# Patient Record
Sex: Female | Born: 1955 | Race: White | Hispanic: No | Marital: Married | Smoking: Never smoker
Health system: Southern US, Community
[De-identification: ages and names within clinical notes are randomized; demographics above are authoritative.]

## PROBLEM LIST (undated history)

## (undated) HISTORY — PX: FOOT SURGERY: SHX648

---

## 2015-10-03 ENCOUNTER — Emergency Department (HOSPITAL_COMMUNITY)
Admission: EM | Admit: 2015-10-03 | Discharge: 2015-10-03 | Disposition: A | Discharge status: Home / Self Care | Payer: BLUE CROSS/BLUE SHIELD | Attending: Emergency Medicine | Admitting: Emergency Medicine

## 2015-10-03 ENCOUNTER — Encounter (HOSPITAL_COMMUNITY): Payer: Self-pay | Admitting: *Deleted

## 2015-10-03 ENCOUNTER — Emergency Department (HOSPITAL_COMMUNITY): Payer: BLUE CROSS/BLUE SHIELD

## 2015-10-03 DIAGNOSIS — S42294A Other nondisplaced fracture of upper end of right humerus, initial encounter for closed fracture: Secondary | ICD-10-CM | POA: Insufficient documentation

## 2015-10-03 DIAGNOSIS — Y998 Other external cause status: Secondary | ICD-10-CM | POA: Insufficient documentation

## 2015-10-03 DIAGNOSIS — S4991XA Unspecified injury of right shoulder and upper arm, initial encounter: Secondary | ICD-10-CM | POA: Diagnosis present

## 2015-10-03 DIAGNOSIS — W000XXA Fall on same level due to ice and snow, initial encounter: Secondary | ICD-10-CM | POA: Diagnosis not present

## 2015-10-03 DIAGNOSIS — Y9289 Other specified places as the place of occurrence of the external cause: Secondary | ICD-10-CM | POA: Diagnosis not present

## 2015-10-03 DIAGNOSIS — Y9389 Activity, other specified: Secondary | ICD-10-CM | POA: Insufficient documentation

## 2015-10-03 DIAGNOSIS — S42201A Unspecified fracture of upper end of right humerus, initial encounter for closed fracture: Secondary | ICD-10-CM

## 2015-10-03 DIAGNOSIS — M25511 Pain in right shoulder: Secondary | ICD-10-CM

## 2015-10-03 DIAGNOSIS — Z791 Long term (current) use of non-steroidal anti-inflammatories (NSAID): Secondary | ICD-10-CM | POA: Diagnosis not present

## 2015-10-03 DIAGNOSIS — Z79899 Other long term (current) drug therapy: Secondary | ICD-10-CM | POA: Diagnosis not present

## 2015-10-03 MED ORDER — LIDOCAINE HCL (PF) 1 % IJ SOLN
30.0000 mL | Freq: Once | INTRAMUSCULAR | Status: DC
  Filled 2015-10-03: qty 30

## 2015-10-03 MED ORDER — HYDROMORPHONE HCL 1 MG/ML IJ SOLN
1.0000 mg | Freq: Once | INTRAMUSCULAR | Status: AC
  Administered 2015-10-03: 1 mg via INTRAMUSCULAR
  Filled 2015-10-03: qty 1

## 2015-10-03 MED ORDER — HYDROCODONE-ACETAMINOPHEN 5-325 MG PO TABS: 1.0000 | ORAL_TABLET | Freq: Four times a day (QID) | ORAL | Status: AC | PRN

## 2015-10-03 NOTE — Discharge Instructions (Signed)
Humerus Fracture Treated With Immobilization °The humerus is the large bone in your upper arm. You have a broken (fractured) humerus. These fractures are easily diagnosed with X-rays. °TREATMENT  °Simple fractures which will heal without disability are treated with simple immobilization. Immobilization means you will wear a cast, splint, or sling. You have a fracture which will do well with immobilization. The fracture will heal well simply by being held in a good position until it is stable enough to begin range of motion exercises. Do not take part in activities which would further injure your arm.  °HOME CARE INSTRUCTIONS  °· Put ice on the injured area. °¨ Put ice in a plastic bag. °¨ Place a towel between your skin and the bag. °¨ Leave the ice on for 15-20 minutes, 03-04 times a day. °· If you have a cast: °¨ Do not scratch the skin under the cast using sharp or pointed objects. °¨ Check the skin around the cast every day. You may put lotion on any red or sore areas. °¨ Keep your cast dry and clean. °· If you have a splint: °¨ Wear the splint as directed. °¨ Keep your splint dry and clean. °¨ You may loosen the elastic around the splint if your fingers become numb, tingle, or turn cold or blue. °· If you have a sling: °¨ Wear the sling as directed. °· Do not put pressure on any part of your cast or splint until it is fully hardened. °· Your cast or splint can be protected during bathing with a plastic bag. Do not lower the cast or splint into water. °· Only take over-the-counter or prescription medicines for pain, discomfort, or fever as directed by your caregiver. °· Do range of motion exercises as instructed by your caregiver. °· Follow up as directed by your caregiver. This is very important in order to avoid permanent injury or disability and chronic pain. °SEEK IMMEDIATE MEDICAL CARE IF:  °· Your skin or nails in the injured arm turn blue or gray. °· Your arm feels cold or numb. °· You develop severe pain  in the injured arm. °· You are having problems with the medicines you were given. °MAKE SURE YOU:  °· Understand these instructions. °· Will watch your condition. °· Will get help right away if you are not doing well or get worse. °  °This information is not intended to replace advice given to you by your health care provider. Make sure you discuss any questions you have with your health care provider. °  °Document Released: 12/17/2000 Document Revised: 10/01/2014 Document Reviewed: 02/02/2015 °Elsevier Interactive Patient Education ©2016 [employer] ° °

## 2015-10-03 NOTE — ED Notes (Signed)
Pt reports falling and landing on rt shoulder in ice. Pt reports pain to shoulder. Pt able to move fingers and has sensation.

## 2015-10-03 NOTE — ED Notes (Signed)
MD at bedside. 

## 2015-10-03 NOTE — ED Provider Notes (Signed)
CSN: [id number]     Arrival date & time 10/03/15  0841 History   First MD Initiated Contact with Patient 10/03/15 [id number]     Chief Complaint  Patient presents with  . Fall  . Shoulder Injury   60 yo caucasian F w/PMH of sciatica on meloxicam who presents w/right shoulder pain after fall. She slipped on the ice and fell directly onto her right shoulder and heard a pop. Initially couldn't move her arm at all 2/2 pain. Now keeps elbow bent and arm against her chest and can easily mover her hand and wrist. Complains of anterior shoulder pain, sharp and achy, nonradiating. Had no LOC, didn't strike head.   She denies HA, neck pain, numbness/tingling in arm, CP, SOB, fever, chills, N/V, diarrhea, constipation, hematemesis, dysuria, hematuria, sick contacts, or recent travel.   (Consider location/radiation/quality/duration/timing/severity/associated sxs/prior Treatment) Patient is a 60 y.o. female presenting with shoulder injury.  Shoulder Injury This is a new problem. The current episode started today. The problem occurs constantly. The problem has been unchanged. Associated symptoms include arthralgias (right shoulder pain). Pertinent negatives include no abdominal pain, chest pain, chills, coughing, diaphoresis, fever, headaches, nausea, neck pain, numbness or vomiting. Exacerbated by: moving right arm. She has tried nothing for the symptoms. The treatment provided no relief.    History reviewed. No pertinent past medical history. Past Surgical History  Procedure Laterality Date  . Foot surgery     No family history on file. Social History  Substance Use Topics  . Smoking status: Never Smoker   . Smokeless tobacco: None  . Alcohol Use: No   OB History    No data available     Review of Systems  Constitutional: Negative for fever, chills and diaphoresis.  Respiratory: Negative for cough and shortness of breath.   Cardiovascular: Negative for chest pain, palpitations and leg swelling.   Gastrointestinal: Negative for nausea, vomiting, abdominal pain, diarrhea, constipation and abdominal distention.  Genitourinary: Negative for dysuria, frequency, flank pain and decreased urine volume.  Musculoskeletal: Positive for arthralgias (right shoulder pain). Negative for back pain and neck pain.  Neurological: Negative for dizziness, speech difficulty, light-headedness, numbness and headaches.  All other systems reviewed and are negative.     Allergies  Ciprofloxacin  Home Medications   Prior to Admission medications   Medication Sig Start Date End Date Taking? Authorizing Provider  Calcium-Vitamin D-Vitamin K (CALCIUM + D) [phone] MG-UNT-MCG CHEW Chew 1 tablet by mouth daily.   Yes Historical Provider, MD  Cholecalciferol (VITAMIN D) 2000 units CAPS Take 2,000 Units by mouth daily.   Yes Historical Provider, MD  Cyanocobalamin (VITAMIN B 12) 100 MCG LOZG Take 100 mcg by mouth daily.   Yes Historical Provider, MD  HYDROcodone-acetaminophen (NORCO/VICODIN) 5-325 MG tablet Take 1-2 tablets by mouth every 6 (six) hours as needed for moderate pain or severe pain. 10/03/15   Rachelle HoraKeri Greyden Besecker, MD  meloxicam (MOBIC) 15 MG tablet Take 15 mg by mouth daily.   Yes Historical Provider, MD  Multiple Vitamins-Minerals (MULTIVITAMIN WITH MINERALS) tablet Take 1 tablet by mouth daily.   Yes Historical Provider, MD  Omega-3 Fatty Acids (FISH OIL) 1000 MG CPDR Take 1,000 mg by mouth daily.   Yes Historical Provider, MD  pseudoephedrine (SUDAFED) 30 MG tablet Take 30 mg by mouth every 4 (four) hours as needed for congestion.   Yes Historical Provider, MD  psyllium (METAMUCIL) 58.6 % powder Take 1 packet by mouth daily.   Yes Historical Provider, MD  Resveratrol  100 MG CAPS Take 1 capsule by mouth 2 (two) times daily.   Yes Historical Provider, MD   BP 122/84 mmHg  Pulse 72  Temp(Src) 98.1 F (36.7 C) (Oral)  Resp 16  SpO2 100% Physical Exam  Constitutional: She is oriented to person, place,  and time. She appears well-developed and well-nourished. No distress.  HENT:  Head: Normocephalic and atraumatic.  Cardiovascular: Normal rate, regular rhythm, normal heart sounds and intact distal pulses.  Exam reveals no gallop and no friction rub.   No murmur heard. Pulmonary/Chest: Effort normal and breath sounds normal. No respiratory distress. She has no wheezes. She has no rales. She exhibits no tenderness.  Abdominal: Soft. Bowel sounds are normal. She exhibits no distension and no mass. There is no tenderness. There is no rebound and no guarding.  Musculoskeletal: She exhibits tenderness (right anterior shoulder. ).  Right shoulder appears anterior and superior. Ttp of anterior shoulder. No humerus, elbow, wrist, hand ttp. Normal pulses in wrist. Normal sensation. Compartments soft.  Lymphadenopathy:    She has no cervical adenopathy.  Neurological: She is alert and oriented to person, place, and time. No cranial nerve deficit. Coordination normal.  Skin: Skin is warm and dry. She is not diaphoretic.  Nursing note and vitals reviewed.   ED Course  Procedures (including critical care time) Labs Review Labs Reviewed - No data to display  Imaging Review Dg Shoulder Right  10/03/2015  CLINICAL DATA:  60 year old female who slipped and fell on ice landing on right shoulder with pain. Initial encounter. EXAM: RIGHT SHOULDER - 2+ VIEW COMPARISON:  None. FINDINGS: No glenohumeral joint dislocation. However, there is subtle cortical irregularity about the humeral head and neck suspicious for nondisplaced fracture. Small chronic appearing ossific fragment at the inferior lip of the glenoid. The scapula otherwise appears intact. Right clavicle appears intact. Visible right ribs and lung parenchyma within normal limits. IMPRESSION: 1. Appearance suspicious for nondisplaced proximal right humerus fracture. Noncontrast right shoulder CT may be most valuable for follow-up. 2. No glenohumeral joint  dislocation. Chronic appearing ossific fragment at the inferior lip of the glenoid. Electronically Signed   By: Odessa Fleming M.D.   On: 10/03/2015 09:20   I have personally reviewed and evaluated these images and lab results as part of my medical decision-making.   EKG Interpretation None      MDM   Final diagnoses:  Right shoulder pain  Fracture, humerus, proximal, right, closed, initial encounter   60 yo F w/acute right shoulder pain after fall. See HPI for details. On exam, NAD, AFVSS. As above, shoulder appears anteriorly dislocated. NVI to right extremity. Will obtain XR to eval for frx as well.  Given IM dilaudid and plan to place intra-articular lidocaine for reduction.   XR shows nondisplaced proximal humerus fx. No dislocation. Therefore, placed in sling. DC w/FU to ortho. Given short course of pain meds.    Pt was seen under the supervision of Dr. Hyacinth Meeker.     Rachelle Hora, MD 10/03/15 1610  Eber Hong, MD 10/03/15 [phone]

## 2015-10-03 NOTE — ED Notes (Signed)
Ice pack applied.

## 2019-11-05 ENCOUNTER — Emergency Department (HOSPITAL_COMMUNITY): Payer: BC Managed Care – PPO

## 2019-11-05 ENCOUNTER — Emergency Department (HOSPITAL_COMMUNITY)
Admission: EM | Admit: 2019-11-05 | Discharge: 2019-11-05 | Disposition: A | Discharge status: Home / Self Care | Payer: BC Managed Care – PPO | Attending: Emergency Medicine | Admitting: Emergency Medicine

## 2019-11-05 ENCOUNTER — Other Ambulatory Visit: Payer: Self-pay

## 2019-11-05 ENCOUNTER — Encounter (HOSPITAL_COMMUNITY): Payer: Self-pay

## 2019-11-05 DIAGNOSIS — R0789 Other chest pain: Secondary | ICD-10-CM | POA: Insufficient documentation

## 2019-11-05 DIAGNOSIS — E785 Hyperlipidemia, unspecified: Secondary | ICD-10-CM | POA: Diagnosis not present

## 2019-11-05 DIAGNOSIS — M549 Dorsalgia, unspecified: Secondary | ICD-10-CM | POA: Diagnosis not present

## 2019-11-05 DIAGNOSIS — R110 Nausea: Secondary | ICD-10-CM | POA: Diagnosis not present

## 2019-11-05 DIAGNOSIS — R079 Chest pain, unspecified: Secondary | ICD-10-CM

## 2019-11-05 LAB — CBC
HCT: 41 % (ref 36.0–46.0)
Hemoglobin: 13.2 g/dL (ref 12.0–15.0)
MCH: 28.1 pg (ref 26.0–34.0)
MCHC: 32.2 g/dL (ref 30.0–36.0)
MCV: 87.4 fL (ref 80.0–100.0)
Platelets: 340 10*3/uL (ref 150–400)
RBC: 4.69 MIL/uL (ref 3.87–5.11)
RDW: 13.2 % (ref 11.5–15.5)
WBC: 7.8 10*3/uL (ref 4.0–10.5)
nRBC: 0 % (ref 0.0–0.2)

## 2019-11-05 LAB — BASIC METABOLIC PANEL
Anion gap: 8 (ref 5–15)
BUN: 28 mg/dL — ABNORMAL HIGH (ref 8–23)
CO2: 25 mmol/L (ref 22–32)
Calcium: 9.3 mg/dL (ref 8.9–10.3)
Chloride: 107 mmol/L (ref 98–111)
Creatinine, Ser: 0.94 mg/dL (ref 0.44–1.00)
GFR calc Af Amer: >60 mL/min (ref >60)
GFR calc non Af Amer: >60 mL/min (ref >60)
Glucose, Bld: 159 mg/dL — ABNORMAL HIGH (ref 70–99)
Potassium: 4.1 mmol/L (ref 3.5–5.1)
Sodium: 140 mmol/L (ref 135–145)

## 2019-11-05 LAB — TROPONIN I (HIGH SENSITIVITY)
Troponin I (High Sensitivity): <2 ng/L (ref <18)
Troponin I (High Sensitivity): <2 ng/L (ref <18)

## 2019-11-05 LAB — D-DIMER, QUANTITATIVE (NOT AT ARMC): D-Dimer, Quant: 0.79 ug/mL-FEU — ABNORMAL HIGH (ref 0.00–0.50)

## 2019-11-05 MED ORDER — IOHEXOL 350 MG/ML SOLN
100.0000 mL | Freq: Once | INTRAVENOUS | Status: AC | PRN
  Administered 2019-11-05: 100 mL via INTRAVENOUS

## 2019-11-05 NOTE — ED Provider Notes (Signed)
[facility] EMERGENCY DEPARTMENT Provider Note   CSN: [id number] Arrival date & time: 11/05/19  1351     History Chief Complaint  Patient presents with  . Chest Pain   Katherine Barajas is a 64 y.o. female.  The history is provided by the patient and medical records.  Chest Pain Pain location:  Substernal area Pain quality: sharp and stabbing   Pain radiates to:  Upper back Pain severity:  Severe Onset quality:  Sudden Duration:  3 hours Timing:  Intermittent Progression:  Waxing and waning Chronicity:  New Context: at rest   Context: not trauma   Relieved by:  Nitroglycerin Worsened by:  Deep breathing and certain positions Associated symptoms: back pain, diaphoresis and nausea   Associated symptoms: no abdominal pain, no cough, no fever, no lower extremity edema, no shortness of breath and no vomiting   Risk factors: high cholesterol   Risk factors: no coronary artery disease, no diabetes mellitus, no hypertension, not female, not obese, no prior DVT/PE and no surgery        History reviewed. No pertinent past medical history.  There are no problems to display for this patient.   Past Surgical History:  Procedure Laterality Date  . FOOT SURGERY       OB History   No obstetric history on file.     History reviewed. No pertinent family history.  Social History   Tobacco Use  . Smoking status: Never Smoker  Substance Use Topics  . Alcohol use: No  . Drug use: Not on file    Home Medications Prior to Admission medications   Medication Sig Start Date End Date Taking? Authorizing Provider  Cholecalciferol (VITAMIN D) 2000 units CAPS Take 2,000 Units by mouth daily.   Yes [provider]  meloxicam (MOBIC) 15 MG tablet Take 15 mg by mouth daily.   Yes [provider]  Multiple Vitamins-Minerals (MULTIVITAMIN WITH MINERALS) tablet Take 1 tablet by mouth daily.   Yes [provider]  Omega-3 Fatty Acids (FISH  OIL) 1000 MG CPDR Take 2,000 mg by mouth daily.    Yes [provider]  omeprazole (PRILOSEC) 40 MG capsule Take 40 mg by mouth daily.   Yes [provider]  psyllium (METAMUCIL) 58.6 % powder Take 1 packet by mouth daily.   Yes [provider]  Red Yeast Rice Extract 600 MG TABS Take 3 tablets by mouth daily.   Yes [provider]  HYDROcodone-acetaminophen (NORCO/VICODIN) 5-325 MG tablet Take 1-2 tablets by mouth every 6 (six) hours as needed for moderate pain or severe pain. Patient not taking: Reported on 11/05/2019 10/03/15   Rachelle Hora, MD    Allergies    Ciprofloxacin and Dilaudid [hydromorphone]  Review of Systems   Review of Systems  Constitutional: Positive for diaphoresis. Negative for fever.  Respiratory: Negative for cough and shortness of breath.   Cardiovascular: Positive for chest pain. Negative for leg swelling.  Gastrointestinal: Positive for nausea. Negative for abdominal pain and vomiting.  Musculoskeletal: Positive for back pain.  All other systems reviewed and are negative.   Physical Exam Updated Vital Signs BP 125/71 (BP Location: Right Arm)   Pulse 78   Temp 98.3 F (36.8 C) (Oral)   Resp 20   Ht 5' (1.524 m)   Wt 76.2 kg   SpO2 100%   BMI 32.81 kg/m   Physical Exam Vitals and nursing note reviewed.  Constitutional:      Appearance: She  is well-developed. She is not toxic-appearing or diaphoretic.  HENT:     Head: Normocephalic and atraumatic.  Eyes:     Conjunctiva/sclera: Conjunctivae normal.  Cardiovascular:     Rate and Rhythm: Normal rate and regular rhythm.     Pulses:          Radial pulses are 2+ on the right side and 2+ on the left side.       Dorsalis pedis pulses are 2+ on the right side and 2+ on the left side.     Heart sounds: No murmur.  Pulmonary:     Effort: Pulmonary effort is normal. No respiratory distress.     Breath sounds: Normal breath sounds.  Chest:     Chest wall: Tenderness  present.  Abdominal:     Palpations: Abdomen is soft.     Tenderness: There is no abdominal tenderness.  Musculoskeletal:     Cervical back: Neck supple.     Right lower leg: Edema (trace) present.     Left lower leg: Edema (trace) present.  Skin:    General: Skin is warm and dry.  Neurological:     Mental Status: She is alert.     ED Results / Procedures / Treatments   Labs (all labs ordered are listed, but only abnormal results are displayed) Labs Reviewed  BASIC METABOLIC PANEL - Abnormal; Notable for the following components:      Result Value   Glucose, Bld 159 (*)    BUN 28 (*)    All other components within normal limits  D-DIMER, QUANTITATIVE (NOT AT [facility]) - Abnormal; Notable for the following components:   D-Dimer, Quant 0.79 (*)    All other components within normal limits  CBC  TROPONIN I (HIGH SENSITIVITY)  TROPONIN I (HIGH SENSITIVITY)    EKG EKG Interpretation  Date/Time:  Thursday November 05 2019 13:56:27 EST Ventricular Rate:  82 PR Interval:  144 QRS Duration: 76 QT Interval:  352 QTC Calculation: 411 R Axis:   25 Text Interpretation: Normal sinus rhythm Low voltage QRS Cannot rule out Anterior infarct , age undetermined Abnormal ECG Confirmed by Madalyn Rob [phone]) on 11/05/2019 3:42:20 PM   Radiology DG Chest 2 View  Result Date: 11/05/2019 CLINICAL DATA:  Chest pain and nausea EXAM: CHEST - 2 VIEW COMPARISON:  None. FINDINGS: The heart size and mediastinal contours are within normal limits. Both lungs are clear. The visualized skeletal structures are unremarkable. IMPRESSION: No active cardiopulmonary disease. Electronically Signed   By: Inez Catalina M.D.   On: 11/05/2019 14:27   CT ANGIO CHEST PE W OR WO CONTRAST  Result Date: 11/05/2019 CLINICAL DATA:  Diaphoresis and dizziness after eating. Chest and back pain. EXAM: CT ANGIOGRAPHY CHEST WITH CONTRAST TECHNIQUE: Multidetector CT imaging of the chest was performed using the standard  protocol during bolus administration of intravenous contrast. Multiplanar CT image reconstructions and MIPs were obtained to evaluate the vascular anatomy. CONTRAST:  152mL OMNIPAQUE IOHEXOL 350 MG/ML SOLN COMPARISON:  None. FINDINGS: Cardiovascular: Pulmonary arterial opacification is good. There are no pulmonary emboli. No evidence of aortic atherosclerosis. No visible coronary artery calcification. Heart size is normal. Mediastinum/Nodes: No mediastinal or hilar mass or lymphadenopathy. Lungs/Pleura: Linear density in the right middle lobe likely represent a scar. Lung bases otherwise clear. No pleural fluid. Upper Abdomen: 2 adjacent cysts in the left lobe of the liver, the larger measuring 2.7 cm in diameter. Musculoskeletal: Negative Review of the MIP images confirms the above findings. IMPRESSION:  Negative CT angiography of the chest. No pulmonary emboli or other acute chest pathology. Likely linear scar in the right middle lobe. Electronically Signed   By: Paulina Fusi M.D.   On: 11/05/2019 19:50    Procedures Procedures (including critical care time)  Medications Ordered in ED Medications  iohexol (OMNIPAQUE) 350 MG/ML injection 100 mL (100 mLs Intravenous Contrast Given 11/05/19 1920)    ED Course  I have reviewed the triage vital signs and the nursing notes.  Pertinent labs & imaging results that were available during my care of the patient were reviewed by me and considered in my medical decision making (see chart for details).    MDM Rules/Calculators/A&P                      Katherine Barajas is a 64 y.o. female with past medical history of hyperlipidemia, chronic low back pain who presents to the ED for chest pain.  HEAR of 4 but troponin undetectably negative x 2, no ischemic changes on EKG.  Low risk wells, dimer elevated, CTA without evidence of PE.  No pulse deficits, no tearing/ripping pain, no neurologic deficits and reassuring CTA, do not suspect dissection.  Pt does have  history of PUD, GERD maybe the cause of her symptoms.    Will follow up with PCP.  Strict return precautions given.  Discharged in stable condition.      Final Clinical Impression(s) / ED Diagnoses Final diagnoses:  Chest pain, unspecified type    Rx / DC Orders ED Discharge Orders    None       Leva Baine, [place], MD 11/05/19 2324    Milagros Loll, MD 11/05/19 2328

## 2019-11-05 NOTE — ED Triage Notes (Addendum)
Pt BIB [facility] for eval of sudden onset CP radiation to back, diaphoresis and nausea. Pt reports 7/10 CP. Pt states pain started at 10/10, decreased to 7/10 on EMS arrival and down to 2/10 after 1 SL NTG. EMS reports appeared pale, diaphoretic, dizzy. EMS reports by her presentation they were concerned for significant cardiac event. Pt appears much better in triage, but states pain is beginning to return

## 2020-12-25 IMAGING — CR DG CHEST 2V
2 series · 2 of 2 positions shown · non-contrast
Comparison: None.

CLINICAL DATA: Chest pain and nausea

EXAM:
CHEST - 2 VIEW

[chest pa]
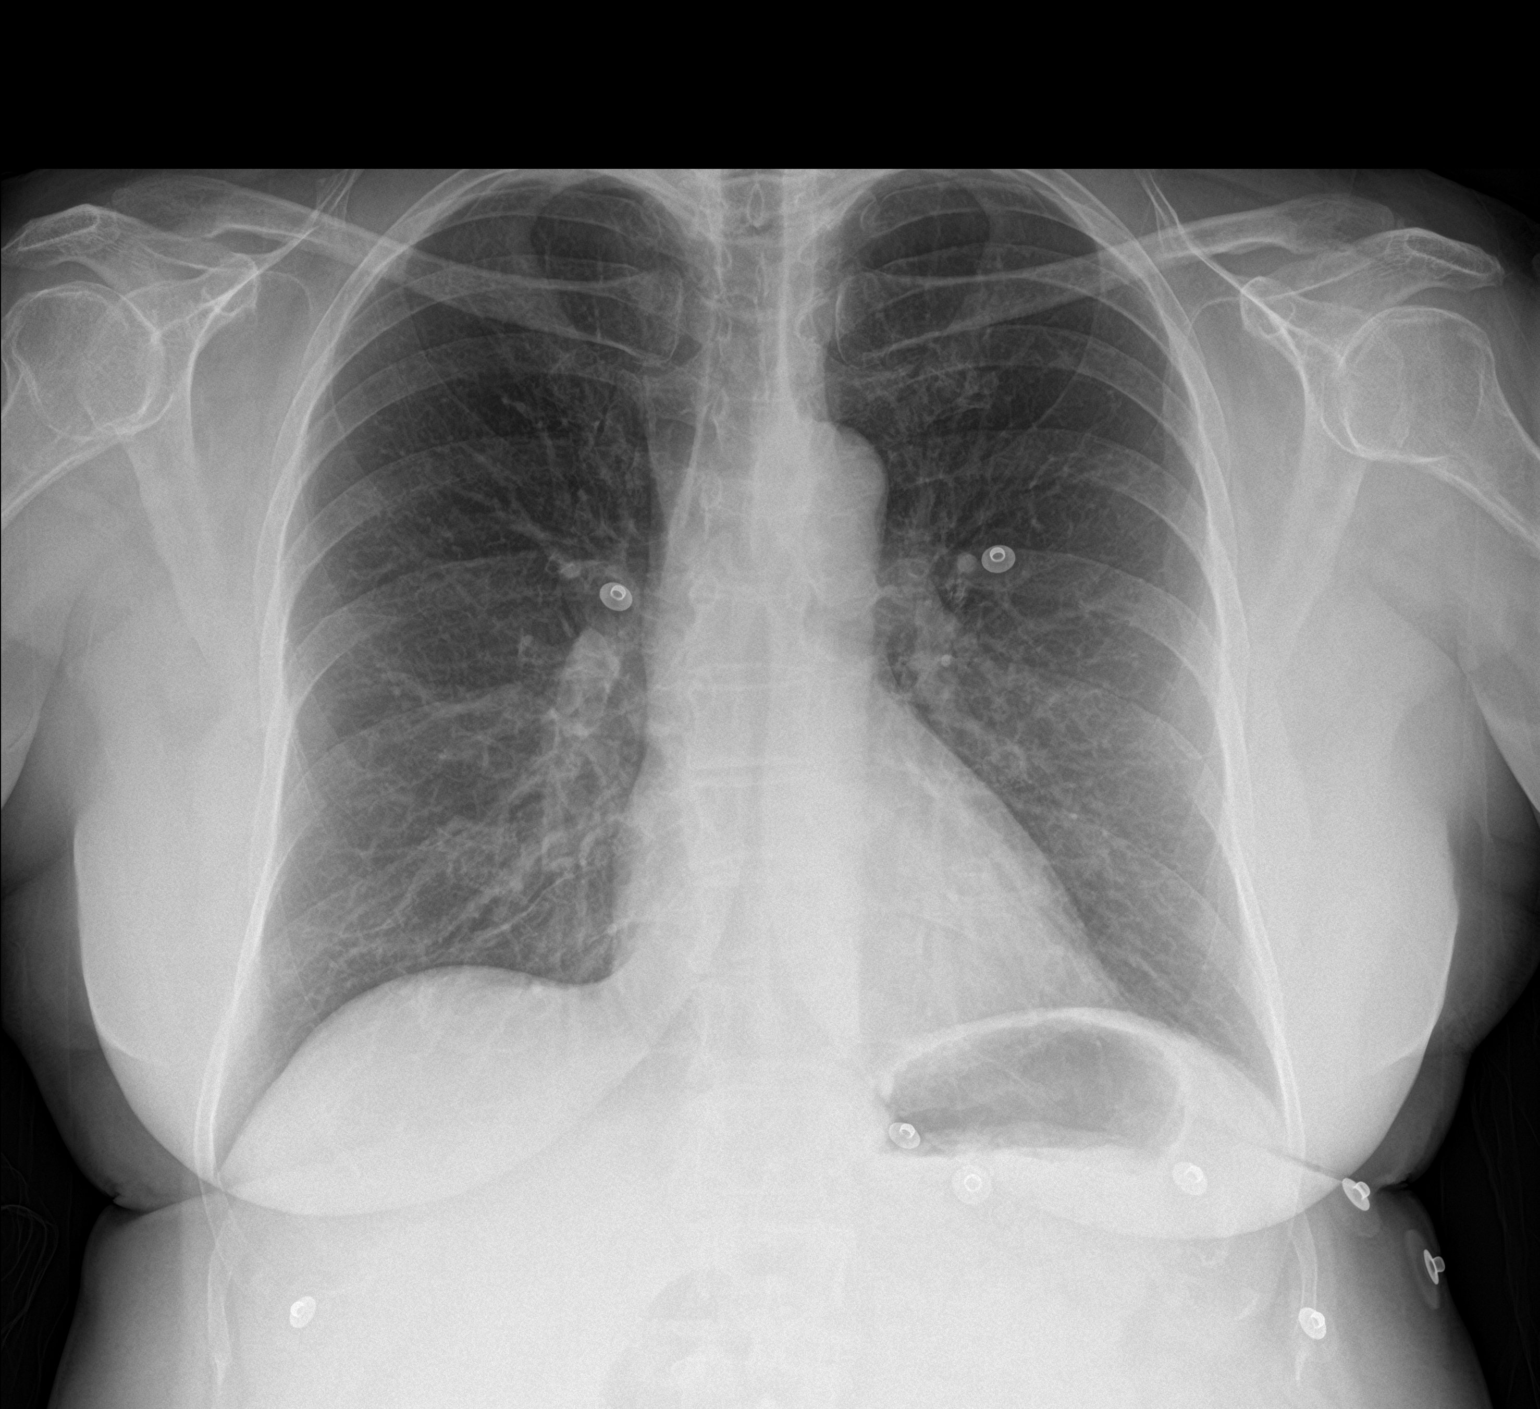

[chest lat]
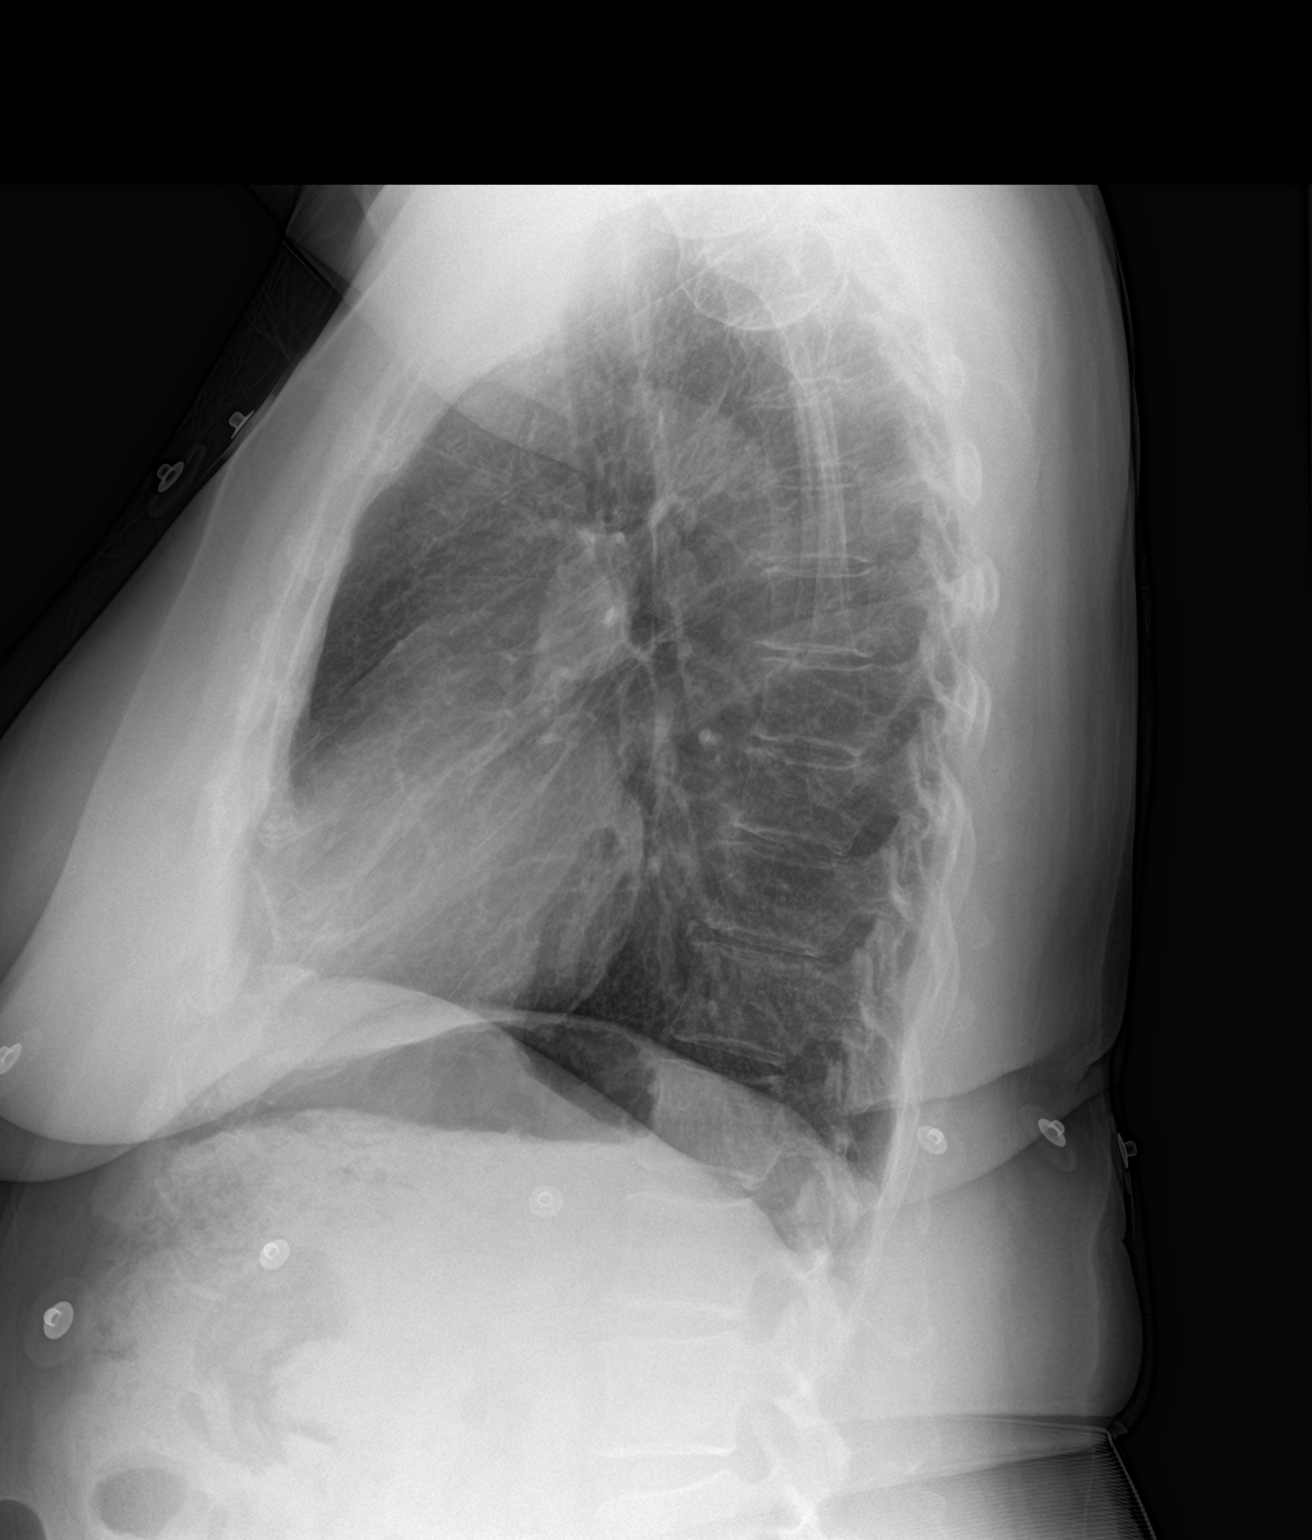

[2 of 2 positions shown; findings below may reference images not displayed]

FINDINGS: The heart size and mediastinal contours are within normal limits.
Both lungs are clear. The visualized skeletal structures are
unremarkable.
IMPRESSION: No active cardiopulmonary disease.
